# Patient Record
Sex: Female | Born: 1950 | Race: White | Hispanic: No | Marital: Married
Health system: Midwestern US, Community
[De-identification: ages and names within clinical notes are randomized; demographics above are authoritative.]

## PROBLEM LIST (undated history)

## (undated) DIAGNOSIS — N281 Cyst of kidney, acquired: Secondary | ICD-10-CM

## (undated) DIAGNOSIS — N200 Calculus of kidney: Secondary | ICD-10-CM

---

## 2016-07-17 ENCOUNTER — Telehealth

## 2016-07-17 NOTE — Telephone Encounter (Signed)
-----   Message from [facility], [place] sent at 07/17/2016  8:52 AM EDT -----  Regarding: ct  Order chem 7 and ct abd without and with iv contrast- tell patient recent CT chest showed renal cyst that we need to look at further- make apt for a few days after ct done.

## 2016-07-27 NOTE — Progress Notes (Signed)
Get in this week f/u ct.

## 2016-07-28 ENCOUNTER — Ambulatory Visit: Admit: 2016-07-28 | Discharge: 2016-07-28 | Payer: MEDICARE | Attending: Urology | Primary: Internal Medicine

## 2016-07-28 DIAGNOSIS — Z87442 Personal history of urinary calculi: Secondary | ICD-10-CM

## 2016-07-28 LAB — AMB POC URINALYSIS DIP STICK AUTO W/ MICRO (MICRO RESULTS)
Bilirubin (UA POC): NEGATIVE
Crystals (UA POC): NEGATIVE
Epithelial cells (UA POC): 0
Glucose (UA POC): NEGATIVE
Ketones (UA POC): NEGATIVE
Leukocyte esterase (UA POC): NEGATIVE
Nitrites (UA POC): NEGATIVE
Protein (UA POC): NEGATIVE mg/dL
RBCs (UA POC): 0
Specific gravity (UA POC): 1.01 (ref 1.001–1.035)
Urobilinogen (UA POC): 0.2 (ref 0.2–1)
WBCs (UA POC): 0
pH (UA POC): 7 (ref 4.6–8.0)

## 2016-07-28 NOTE — Addendum Note (Signed)
Addended by: Etheleen SiaHOLLAND, Krosby Ritchie on: 09/13/2017 12:27 PM     Modules accepted: Orders

## 2016-07-28 NOTE — Progress Notes (Signed)
HISTORY OF PRESENT ILLNESS:  Lori Walter is a 65 y.o. female who presents today for f/u hx of stones, but recently had CT of the chest which showed a partially visualized right renal cyst. 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     Pt reports doing well. No stone issues right now.       AUA Symptom Score 07/28/2016   Over the past month how often have you had the sensation that your bladder was not completely empty after you finished urinating? 0   Over the past month, how often have had to urinate again less than 2 hours after you last finished urinating? 1   Over the past month, how often have you found you stopped and started again several times when you urinated? 0   Over the past month, how often have you found it difficult to postpone urination? 1   Over the past month, how often have you had a weak urinary stream? 0   Over the past month, how often have you had to push or strain to begin urinating? 1   Over the past month, how many times did you most typically get up to urinate from the time you went to bed at night until the time you got up in the morning? 2   AUA Score 5   If you were to spend the rest of your life with your urinary condition the way it is now, how would you feel about that? Delighted       Past Medical History:   Diagnosis Date   ??? Hypercholesterolemia    ??? Hypertension    ??? Kidney stone    ??? Sick sinus syndrome (HCC)        History reviewed. No pertinent surgical history.    Social History   Substance Use Topics   ??? Smoking status: Former Smoker     Quit date: 1977   ??? Smokeless tobacco: Never Used   ??? Alcohol use Yes       No Known Allergies    Family History   Problem Relation Age of Onset   ??? Hypertension Mother    ??? Heart Disease Mother    ??? Cancer Mother    ??? Cancer Father    ??? Cancer Sister    ??? Depression Brother    ??? Cancer Maternal Aunt         Current Outpatient Prescriptions   Medication Sig Dispense Refill   ??? LIBRAX, WITH CLINIDIUM, 5-2.5 mg per capsule TAKE ONE CAPSULE BY MOUTH 3 TIMES A DAY  1   ??? escitalopram oxalate (LEXAPRO) 10 mg tablet TAKE 1 TABLET BY MOUTH EVERY EVENING  1   ??? aspirin delayed-release 81 mg tablet Take  by mouth daily.     ??? B.ANI-L.ACI-L.SAL-L.PLAN-L.CAS PO Take  by mouth.     ??? enalapril (VASOTEC) 2.5 mg tablet Take  by mouth daily.     ??? multivitamin (ONE A DAY) tablet Take 1 Tab by mouth daily.         REVIEW OF SYSTEMS:  Constitutional: Fever:   Skin: Rash:   HEENT: Hearing difficulty:   Eyes: Blurred vision:   Cardiovascular: Chest pain:   Respiratory: Shortness of breath:   Gastrointestinal: Nausea/vomiting:   Musculoskeletal: Back pain:   Neurological: Weakness:   Psychological: Memory loss:   Comments/additional findings:  PHYSICAL EXAMINATION:   Visit Vitals   ??? BP 135/73   ??? Pulse 68   ??? Temp 98 ??F (36.7 ??C)   ??? Resp 18   ??? Ht 5\' 8"  (1.727 m)   ??? Wt 164 lb 9.6 oz (74.7 kg)   ??? BMI 25.03 kg/m2     Constitutional: Well developed, no acute distress.   Eyes:  Conjunctiva normal.  Ears:  External ear normal.  Nose/Throat:  External nose normal.  CV:  Heart rate regular, No peripheral swelling noted.  Respiratory: No respiratory distress, No audible wheeze.  Abdomen:  Soft, nontender, nondistended, no mass/organomegaly.  Vascular:  Abdominal Aorta nonpalpable.  Skin: No rash, No ulcer.    Neuro/Psych:  Patient with appropriate affect.  Alert and oriented x 3.    Gait:  Normal.  GU Female:      CVA: non-tender bilaterally. Bladder not palpable.        REVIEW OF LABS AND IMAGING:      Results for orders placed or performed in visit on 07/28/16   AMB POC URINALYSIS DIP STICK AUTO W/ MICRO (MICRO RESULTS)   Result Value Ref Range    Color (UA POC) Yellow     Clarity (UA POC) Clear     Glucose (UA POC) Negative Negative    Bilirubin (UA POC) Negative Negative    Ketones (UA POC) Negative Negative     Specific gravity (UA POC) 1.010 1.001 - 1.035    Blood (UA POC) Trace Negative    pH (UA POC) 7.0 4.6 - 8.0    Protein (UA POC) Negative Negative mg/dL    Urobilinogen (UA POC) 0.2 mg/dL 0.2 - 1    Nitrites (UA POC) Negative Negative    Leukocyte esterase (UA POC) Negative Negative    Epithelial cells (UA POC) 0     WBCs (UA POC) 0      RBCs (UA POC) 0      Bacteria (UA POC) None Negative    Crystals (UA POC) Negative Negative    Other (UA POC)         ASSESSMENT:     ICD-10-CM ICD-9-CM    1. Personal history of kidney stones Z87.442 V13.01 AMB POC URINALYSIS DIP STICK AUTO W/ MICRO (MICRO RESULTS)   2. Benign cyst of right kidney N28.1 753.10          PLAN:    ?? F/u 1 yr   ?? KUB 2 yrs  ?? No indication for further evaluation of renal cyst  ?? Sees pulmonary tomorrow     Patient's BMI is out of the normal parameters.  Information about BMI was given to the patient.      Chief Complaint   Patient presents with   ??? Renal Cyst     f/u CT     Medical documentation provided with the assistance of Isac SarnaKimber N Moore, medical scribe for [employer] Life[employer]i Tyreon Frigon, DO, [place].    Ulyses Panico Arcadia[place]ughart, DO

## 2016-08-21 DIAGNOSIS — M859 Disorder of bone density and structure, unspecified: Secondary | ICD-10-CM

## 2016-08-21 DIAGNOSIS — M8580 Other specified disorders of bone density and structure, unspecified site: Secondary | ICD-10-CM

## 2016-08-21 HISTORY — DX: Other specified disorders of bone density and structure, unspecified site: M85.80

## 2016-08-21 HISTORY — DX: Disorder of bone density and structure, unspecified: M85.9

## 2016-11-19 ENCOUNTER — Encounter: Attending: Urology | Primary: Internal Medicine

## 2017-07-28 ENCOUNTER — Encounter: Attending: Urology | Primary: Internal Medicine

## 2017-08-13 ENCOUNTER — Encounter: Attending: Urology | Primary: Internal Medicine

## 2017-09-10 ENCOUNTER — Ambulatory Visit: Admit: 2017-09-10 | Discharge: 2017-09-10 | Payer: MEDICARE | Attending: Urology | Primary: Internal Medicine

## 2017-09-10 DIAGNOSIS — R7309 Other abnormal glucose: Secondary | ICD-10-CM | POA: Insufficient documentation

## 2017-09-10 DIAGNOSIS — F419 Anxiety disorder, unspecified: Secondary | ICD-10-CM

## 2017-09-10 DIAGNOSIS — Z87442 Personal history of urinary calculi: Secondary | ICD-10-CM

## 2017-09-10 HISTORY — DX: Other abnormal glucose: R73.09

## 2017-09-10 HISTORY — DX: Anxiety disorder, unspecified: F41.9

## 2017-09-10 NOTE — Progress Notes (Signed)
HISTORY OF PRESENT ILLNESS:  Lori CASAMENTO is a 66 y.o. female who presents today for f/u hx of stones and 2.8cm right Bosniak 2 renal cyst.    Pt reports doing well. No stone issues right now. Denies any dysuria or hematuria.    AUA Symptom Score 09/10/2017   Over the past month how often have you had the sensation that your bladder was not completely empty after you finished urinating? 0   Over the past month, how often have had to urinate again less than 2 hours after you last finished urinating? 1   Over the past month, how often have you found you stopped and started again several times when you urinated? 0   Over the past month, how often have you found it difficult to postpone urination? 0   Over the past month, how often have you had a weak urinary stream? 0   Over the past month, how often have you had to push or strain to begin urinating? 1   Over the past month, how many times did you most typically get up to urinate from the time you went to bed at night until the time you got up in the morning? 2   AUA Score 4   If you were to spend the rest of your life with your urinary condition the way it is now, how would you feel about that? Pleased       Past Medical History:   Diagnosis Date   ??? Hypercholesterolemia    ??? Hypertension    ??? Kidney stone    ??? Sick sinus syndrome ([place])      History reviewed. No pertinent surgical history.    Social History     Tobacco Use   ??? Smoking status: Former Smoker     Last attempt to quit: 1977     Years since quitting: 41.8   ??? Smokeless tobacco: Never Used   Substance Use Topics   ??? Alcohol use: Yes   ??? Drug use: No     No Known Allergies    Family History   Problem Relation Age of Onset   ??? Hypertension Mother    ??? Heart Disease Mother    ??? Cancer Mother    ??? Cancer Father    ??? Cancer Sister    ??? Depression Brother    ??? Cancer Maternal Aunt        Current Outpatient Medications   Medication Sig Dispense Refill    ??? Lancing Device with Lancets (ACCU-CHEK FASTCLIX LANCING DEV) kit Accu-Chek FastClix Lancing Device     ??? glucose blood VI test strips (ACCU-CHEK SMARTVIEW TEST STRIP) strip Accu-Chek SmartView Test Strips     ??? escitalopram oxalate (LEXAPRO) 5 mg tablet TAKE 1/2 TABLET BY MOUTH DAILY  0   ??? LIBRAX, WITH CLINIDIUM, 5-2.5 mg per capsule TAKE ONE CAPSULE BY MOUTH 3 TIMES A DAY  5   ??? escitalopram oxalate (LEXAPRO) 10 mg tablet TAKE 1 TABLET BY MOUTH EVERY EVENING  1   ??? aspirin delayed-release 81 mg tablet Take  by mouth daily.     ??? enalapril (VASOTEC) 2.5 mg tablet Take  by mouth daily.     ??? multivitamin (ONE A DAY) tablet Take 1 Tab by mouth daily.         REVIEW OF SYSTEMS:  Constitutional: Fever: No  Skin: Rash: No  HEENT: Hearing difficulty: No  Eyes: Blurred vision: No  Cardiovascular: Chest pain: No  Respiratory: Shortness of breath: No  Gastrointestinal: Nausea/vomiting: No  Musculoskeletal: Back pain: No  Neurological: Weakness: No  Psychological: Memory loss: No  Comments/additional findings:       PHYSICAL EXAMINATION:   Visit Vitals  BP 144/74   Pulse (!) 50   Temp 98 ??F (36.7 ??C) (Temporal)   Resp 18   Ht '5\' 8"'  (1.727 m)   Wt 170 lb (77.1 kg)   BMI 25.85 kg/m??     Constitutional: Well developed, no acute distress.   Eyes:  Conjunctiva normal.  Ears:  External ear normal.  Nose/Throat:  External nose normal.  CV:  Heart rate regular. No peripheral swelling noted.  Respiratory: No respiratory distress. No audible wheeze.  Abdomen:  Soft, nontender, nondistended, no mass/organomegaly.  Vascular:  Abdominal Aorta nonpalpable.  Skin: No rash. No ulcer.    Neuro/Psych:  Patient with appropriate affect.  Alert and oriented x 3.    Gait:  Normal.  GU Female:      CVA: non-tender bilaterally. Bladder not palpable.        REVIEW OF LABS AND IMAGING:    No labs or radiology this visit.      ASSESSMENT:     ICD-10-CM ICD-9-CM    1. Personal history of kidney stones Z87.442 V13.01     2. Benign cyst of right kidney N28.1 753.10         PLAN:    ?? F/u 1 yr w/ KUB    Patient's BMI is out of the normal parameters. Information about BMI was given and patient was advised to follow-up with their PCP for further management.    Chief Complaint   Patient presents with   ??? Kidney [employer] documentation provided with the assistance of Lonell Grandchild, medical scribe for [employer], DO, [place].    Maliq Pilley [place], DO

## 2018-08-29 ENCOUNTER — Encounter

## 2018-08-29 NOTE — Telephone Encounter (Signed)
Called pt to let her know she needs to have KUB done before her next appt on 11/13. She can go to the hospital and have it done anytime before.  I have faxed order over to radiology.   I left VM for pt to call back.

## 2018-09-07 ENCOUNTER — Encounter

## 2018-09-14 ENCOUNTER — Ambulatory Visit: Admit: 2018-09-14 | Discharge: 2018-09-14 | Attending: Urology | Primary: Internal Medicine

## 2018-09-14 ENCOUNTER — Ambulatory Visit: Attending: Urology | Primary: Internal Medicine

## 2018-09-14 DIAGNOSIS — Z87442 Personal history of urinary calculi: Secondary | ICD-10-CM

## 2018-09-14 NOTE — Progress Notes (Signed)
 Progress  Notes by Steen Drown L at 09/14/18 1300                Author: Steen Drown CROME  Service: --  Author Type: Scribe       Filed: 09/16/18 1243  Encounter Date: 09/14/2018  Status: Signed          Editor: Jayme Skates, DO (Physician)                          HISTORY OF PRESENT ILLNESS:  Lori Walter  is a 67 y.o. female who presents  today for f/u hx of stones and 2.8cm right Bosniak 2 renal cyst.  KUB 08/2018 negative for stones. Patient denies stone pain/gh/dysuria.  Occasional hesitancy but not bothersome and has been doing timed voids which have helped.         AUA Symptom Score  09/14/2018        Over the past month how often have you had the sensation that your bladder was not completely empty after you finished urinating?  1     Over the past month, how often have had to urinate again less than 2 hours after you last finished urinating?  1     Over the past month, how often have you found you stopped and started again several times when you urinated?  0     Over the past month, how often have you found it difficult to postpone urination?  1     Over the past month, how often have you had a weak urinary stream?  0     Over the past month, how often have you had to push or strain to begin urinating?  1     Over the past month, how many times did you most typically get up to urinate from the time you went to bed at night until the time you got up in  the morning?  2     AUA Score  6        If you were to spend the rest of your life with your urinary condition the way it is now, how would you feel about that?  Mostly satisfied             Past Medical History:        Diagnosis  Date         ?  Hypercholesterolemia       ?  Hypertension       ?  Kidney stone           ?  Sick sinus syndrome (HCC)          History reviewed. No pertinent surgical history.        Social History          Tobacco Use         ?  Smoking status:  Former Smoker              Last attempt to quit:  1977          Years since quitting:  42.8         ?  Smokeless tobacco:  Never Used       Substance Use Topics         ?  Alcohol use:  Yes         ?  Drug use:  No        No Known  Allergies        Family History         Problem  Relation  Age of Onset          ?  Hypertension  Mother       ?  Heart Disease  Mother       ?  Cancer  Mother       ?  Cancer  Father       ?  Cancer  Sister       ?  Depression  Brother            ?  Cancer  Maternal Aunt               Current Outpatient Medications          Medication  Sig  Dispense  Refill           ?  Lancing Device with Lancets (ACCU-CHEK FASTCLIX LANCING DEV) kit  Accu-Chek FastClix Lancing Device         ?  glucose blood VI test strips (ACCU-CHEK SMARTVIEW TEST STRIP) strip  Accu-Chek SmartView Test Strips         ?  LIBRAX, WITH CLINIDIUM, 5-2.5 mg per capsule  TAKE ONE CAPSULE BY MOUTH 3 TIMES A DAY    5     ?  escitalopram oxalate (LEXAPRO) 10 mg tablet  TAKE 1 TABLET BY MOUTH EVERY EVENING    1     ?  aspirin delayed-release 81 mg tablet  Take  by mouth daily.         ?  enalapril (VASOTEC) 2.5 mg tablet  Take  by mouth daily.               ?  multivitamin (ONE A DAY) tablet  Take 1 Tab by mouth daily.               REVIEW OF SYSTEMS:   Constitutional: Fever: No   Skin: Rash: No   HEENT: Hearing difficulty: No   Eyes: Blurred vision: No   Cardiovascular: Chest pain: No   Respiratory: Shortness of breath: No   Gastrointestinal: Nausea/vomiting: No   Musculoskeletal: Back pain: No   Neurological: Weakness: No   Psychological: Memory loss: No   Comments/additional findings:          PHYSICAL EXAMINATION:    Visit Vitals      BP  137/81     Pulse  (!) 52     Temp  97.5 F (36.4 C)     Resp  18     Ht  5' 8 (1.727 m)     Wt  170 lb 3.2 oz (77.2 kg)        BMI  25.88 kg/m        Constitutional: Well developed, no acute distress.    Eyes:  Conjunctiva normal.   Ears:  External ear normal.   Nose/Throat:  External nose normal.   CV:  Heart rate regular.  No peripheral swelling  noted.   Respiratory: No respiratory distress. No audible wheeze.   Skin: No rash. No ulcer.      Neuro/Psych:  Patient with appropriate affect.  Alert and oriented x 3.      Gait:  Normal.         REVIEW OF LABS AND IMAGING:     No labs or radiology this visit.         ASSESSMENT:  ICD-10-CM  ICD-9-CM             1.  Personal history of kidney stones  Z87.442  V13.01              PLAN:       KUB negative for stones.      F/u 1 yr     kub 2 yrs      Patient's BMI is out of the normal parameters. Information about BMI was given and patient was advised to follow-up with their PCP for further management.        Chief Complaint       Patient presents with        ?  Kidney [employer] documentation provided with the assistance of Harris LOISE Ada, medical  scribe for [employer], DO, [place].      Christi [place], DO

## 2018-09-14 NOTE — Progress Notes (Signed)
HISTORY OF PRESENT ILLNESS:  Lori Walter is a 67 y.o. female who presents today for f/u hx of stones and 2.8cm right Bosniak 2 renal cyst.  KUB 08/2018 negative for stones. Patient denies stone pain/gh/dysuria.  Occasional hesitancy but not bothersome and has been doing timed voids which have helped.    AUA Symptom Score 09/14/2018   Over the past month how often have you had the sensation that your bladder was not completely empty after you finished urinating? 1   Over the past month, how often have had to urinate again less than 2 hours after you last finished urinating? 1   Over the past month, how often have you found you stopped and started again several times when you urinated? 0   Over the past month, how often have you found it difficult to postpone urination? 1   Over the past month, how often have you had a weak urinary stream? 0   Over the past month, how often have you had to push or strain to begin urinating? 1   Over the past month, how many times did you most typically get up to urinate from the time you went to bed at night until the time you got up in the morning? 2   AUA Score 6   If you were to spend the rest of your life with your urinary condition the way it is now, how would you feel about that? Mostly satisfied       Past Medical History:   Diagnosis Date   ??? Hypercholesterolemia    ??? Hypertension    ??? Kidney stone    ??? Sick sinus syndrome ([place])      History reviewed. No pertinent surgical history.    Social History     Tobacco Use   ??? Smoking status: Former Smoker     Last attempt to quit: 1977     Years since quitting: 42.8   ??? Smokeless tobacco: Never Used   Substance Use Topics   ??? Alcohol use: Yes   ??? Drug use: No     No Known Allergies    Family History   Problem Relation Age of Onset   ??? Hypertension Mother    ??? Heart Disease Mother    ??? Cancer Mother    ??? Cancer Father    ??? Cancer Sister    ??? Depression Brother    ??? Cancer Maternal Aunt         Current Outpatient Medications   Medication Sig Dispense Refill   ??? Lancing Device with Lancets (ACCU-CHEK FASTCLIX LANCING DEV) kit Accu-Chek FastClix Lancing Device     ??? glucose blood VI test strips (ACCU-CHEK SMARTVIEW TEST STRIP) strip Accu-Chek SmartView Test Strips     ??? LIBRAX, WITH CLINIDIUM, 5-2.5 mg per capsule TAKE ONE CAPSULE BY MOUTH 3 TIMES A DAY  5   ??? escitalopram oxalate (LEXAPRO) 10 mg tablet TAKE 1 TABLET BY MOUTH EVERY EVENING  1   ??? aspirin delayed-release 81 mg tablet Take  by mouth daily.     ??? enalapril (VASOTEC) 2.5 mg tablet Take  by mouth daily.     ??? multivitamin (ONE A DAY) tablet Take 1 Tab by mouth daily.         REVIEW OF SYSTEMS:  Constitutional: Fever: No  Skin: Rash: No  HEENT: Hearing difficulty: No  Eyes: Blurred vision: No  Cardiovascular: Chest pain: No  Respiratory: Shortness of breath: No  Gastrointestinal: Nausea/vomiting:  No  Musculoskeletal: Back pain: No  Neurological: Weakness: No  Psychological: Memory loss: No  Comments/additional findings:       PHYSICAL EXAMINATION:   Visit Vitals  BP 137/81   Pulse (!) 52   Temp 97.5 ??F (36.4 ??C)   Resp 18   Ht '5\' 8"'  (1.727 m)   Wt 170 lb 3.2 oz (77.2 kg)   BMI 25.88 kg/m??     Constitutional: Well developed, no acute distress.   Eyes:  Conjunctiva normal.  Ears:  External ear normal.  Nose/Throat:  External nose normal.  CV:  Heart rate regular. No peripheral swelling noted.  Respiratory: No respiratory distress. No audible wheeze.  Skin: No rash. No ulcer.    Neuro/Psych:  Patient with appropriate affect.  Alert and oriented x 3.    Gait:  Normal.      REVIEW OF LABS AND IMAGING:    No labs or radiology this visit.      ASSESSMENT:     ICD-10-CM ICD-9-CM    1. Personal history of kidney stones Z87.442 V13.01         PLAN:    ?? KUB negative for stones.   ?? F/u 1 yr  ?? kub 2 yrs    Patient's BMI is out of the normal parameters. Information about BMI was given and patient was advised to follow-up with their PCP for further  management.    Chief Complaint   Patient presents with   ??? Kidney [employer] documentation provided with the assistance of Lonell Grandchild, medical scribe for [employer], DO, [place].    Christi [place], DO

## 2019-09-13 ENCOUNTER — Encounter: Payer: MEDICARE | Attending: Urology | Primary: Internal Medicine

## 2019-09-13 ENCOUNTER — Encounter: Attending: Urology | Primary: Internal Medicine

## 2019-10-13 ENCOUNTER — Encounter: Payer: MEDICARE | Attending: Urology | Primary: Internal Medicine

## 2019-10-16 ENCOUNTER — Ambulatory Visit: Admit: 2019-10-16 | Discharge: 2019-10-16 | Payer: MEDICARE | Attending: Urology | Primary: Internal Medicine

## 2019-10-16 ENCOUNTER — Ambulatory Visit: Attending: Urology | Primary: Internal Medicine

## 2019-10-16 DIAGNOSIS — Z87442 Personal history of urinary calculi: Secondary | ICD-10-CM

## 2019-10-16 NOTE — Progress Notes (Signed)
Progress Notes by Lori Neri, DO at 10/16/19 1030                Author: Lavone Neri, DO  Service: --  Author Type: Physician       Filed: 11/01/19 1641  Encounter Date: 10/16/2019  Status: Signed          Editor: Lori Neri, DO (Physician)                              HISTORY OF PRESENT ILLNESS:  Lori Walter  is a 68 y.o. female who presents  today for f/u hx of stones and 2.8cm right Bosniak 2 renal cyst.  KUB 08/2018 negative for stones. Patient denies stone pain/gh/dysuria.  Occasional hesitancy but not bothersome and has been doing timed voids which have helped.      10/16/19-here for 1 year follow-up history of kidney stones.  Denies any kidney stone pain, gross hematuria, dysuria.         AUA Symptom Score  10/16/2019        Over the past month how often have you had the sensation that your bladder was not completely empty after you finished urinating?  0     Over the past month, how often have had to urinate again less than 2 hours after you last finished urinating?  0     Over the past month, how often have you found you stopped and started again several times when you urinated?  0     Over the past month, how often have you found it difficult to postpone urination?  1     Over the past month, how often have you had a weak urinary stream?  0     Over the past month, how often have you had to push or strain to begin urinating?  1     Over the past month, how many times did you most typically get up to urinate from the time you went to bed at night until the time you got up in  the morning?  2     AUA Score  4        If you were to spend the rest of your life with your urinary condition the way it is now, how would you feel about that?  Pleased             Past Medical History:        Diagnosis  Date         ?  Hypercholesterolemia       ?  Hypertension       ?  Kidney stone           ?  Sick sinus syndrome ([place])             History reviewed. No pertinent surgical  history.        Social History          Tobacco Use         ?  Smoking status:  Former Smoker              Quit date:  1977         Years since quitting:  44.0         ?  Smokeless tobacco:  Never Used       Substance Use Topics         ?  Alcohol  use:  Yes         ?  Drug use:  No             Allergies        Allergen  Reactions         ?  Ace Inhibitors  Cough     ?  Losartan  Unknown (comments)         ?  Nifedipine  Unknown (comments)             Family History         Problem  Relation  Age of Onset          ?  Hypertension  Mother       ?  Heart Disease  Mother       ?  Cancer  Mother       ?  Cancer  Father       ?  Cancer  Sister       ?  Depression  Brother            ?  Cancer  Maternal Aunt               Current Outpatient Medications          Medication  Sig  Dispense  Refill           ?  Bifidobacterium Infantis 4 mg cap  Take 1 Cap by mouth daily.         ?  Lactobacillus acidophilus 10 billion cell cap  Take  by mouth.         ?  Lancing Device with Lancets (ACCU-CHEK FASTCLIX LANCING DEV) kit  Accu-Chek FastClix Lancing Device         ?  glucose blood VI test strips (ACCU-CHEK SMARTVIEW TEST STRIP) strip  Accu-Chek SmartView Test Strips         ?  LIBRAX, WITH CLINIDIUM, 5-2.5 mg per capsule  TAKE ONE CAPSULE BY MOUTH 3 TIMES A DAY    5     ?  escitalopram oxalate (LEXAPRO) 10 mg tablet  TAKE 1 TABLET BY MOUTH EVERY EVENING    1     ?  aspirin delayed-release 81 mg tablet  Take  by mouth daily.         ?  enalapril (VASOTEC) 2.5 mg tablet  Take  by mouth daily.               ?  multivitamin (ONE A DAY) tablet  Take 1 Tab by mouth daily.               REVIEW OF SYSTEMS:   Constitutional: Fever: No   Skin: Rash: No   HEENT: Hearing difficulty: No   Eyes: Blurred vision: No   Cardiovascular: Chest pain: No   Respiratory: Shortness of breath: No   Gastrointestinal: Nausea/vomiting: No   Musculoskeletal: Back pain: No   Neurological: Weakness: No   Psychological: Memory loss: No   Comments/additional  findings:       PHYSICAL EXAMINATION:    Visit Vitals      Temp  98 ??F (36.7 ??C) (Temporal)     Ht  '5\' 8"'$  (1.727 m)     Wt  172 lb (78 kg)        BMI  26.15 kg/m??        Constitutional: Well developed, no acute distress.    Eyes:  Conjunctiva normal.   Ears:  External ear normal.   Nose/Throat:  External nose normal.   CV:  Heart rate regular, No peripheral swelling noted.   Respiratory: No respiratory distress, No audible wheeze.   Skin: No rash, No ulcer.     Neuro/Psych:  Patient with appropriate affect.  Alert and oriented x 3.     Gait:  Normal.      REVIEW OF LABS AND IMAGING:          Results for orders placed or performed in visit on 07/28/16     AMB POC URINALYSIS DIP STICK AUTO W/ MICRO (MICRO RESULTS)         Result  Value  Ref Range            Color (UA POC)  Yellow         Clarity (UA POC)  Clear         Glucose (UA POC)  Negative  Negative       Bilirubin (UA POC)  Negative  Negative       Ketones (UA POC)  Negative  Negative       Specific gravity (UA POC)  1.010  1.001 - 1.035       Blood (UA POC)  Trace  Negative       pH (UA POC)  7.0  4.6 - 8.0       Protein (UA POC)  Negative  Negative mg/dL       Urobilinogen (UA POC)  0.2 mg/dL  0.2 - 1       Nitrites (UA POC)  Negative  Negative       Leukocyte esterase (UA POC)  Negative  Negative       Epithelial cells (UA POC)  0         WBCs (UA POC)  0          RBCs (UA POC)  0          Bacteria (UA POC)  None  Negative       Crystals (UA POC)  Negative  Negative            Other (UA POC)               ASSESSMENT:              ICD-10-CM  ICD-9-CM             1.  Personal history of kidney stones   Z87.442  V13.01                       PLAN:     ??  KUB in 1 year             Chief Complaint       Patient presents with        ?  Follow-up             63yr       ?  Kidney Stone           A copy of today's office visit was sent to the referring physician.        Lori Walter HEast [place] DO

## 2019-10-16 NOTE — Progress Notes (Signed)
HISTORY OF PRESENT ILLNESS:  Lori Walter is a 68 y.o. female who presents today for f/u hx of stones and 2.8cm right Bosniak 2 renal cyst.  KUB 08/2018 negative for stones. Patient denies stone pain/gh/dysuria.  Occasional hesitancy but not bothersome and has been doing timed voids which have helped.    10/16/19-here for 1 year follow-up history of kidney stones.  Denies any kidney stone pain, gross hematuria, dysuria.    AUA Symptom Score 10/16/2019   Over the past month how often have you had the sensation that your bladder was not completely empty after you finished urinating? 0   Over the past month, how often have had to urinate again less than 2 hours after you last finished urinating? 0   Over the past month, how often have you found you stopped and started again several times when you urinated? 0   Over the past month, how often have you found it difficult to postpone urination? 1   Over the past month, how often have you had a weak urinary stream? 0   Over the past month, how often have you had to push or strain to begin urinating? 1   Over the past month, how many times did you most typically get up to urinate from the time you went to bed at night until the time you got up in the morning? 2   AUA Score 4   If you were to spend the rest of your life with your urinary condition the way it is now, how would you feel about that? Pleased       Past Medical History:   Diagnosis Date   ??? Hypercholesterolemia    ??? Hypertension    ??? Kidney stone    ??? Sick sinus syndrome ([place])        History reviewed. No pertinent surgical history.    Social History     Tobacco Use   ??? Smoking status: Former Smoker     Quit date: 1977     Years since quitting: 44.0   ??? Smokeless tobacco: Never Used   Substance Use Topics   ??? Alcohol use: Yes   ??? Drug use: No       Allergies   Allergen Reactions   ??? Ace Inhibitors Cough   ??? Losartan Unknown (comments)   ??? Nifedipine Unknown (comments)       Family History    Problem Relation Age of Onset   ??? Hypertension Mother    ??? Heart Disease Mother    ??? Cancer Mother    ??? Cancer Father    ??? Cancer Sister    ??? Depression Brother    ??? Cancer Maternal Aunt        Current Outpatient Medications   Medication Sig Dispense Refill   ??? Bifidobacterium Infantis 4 mg cap Take 1 Cap by mouth daily.     ??? Lactobacillus acidophilus 10 billion cell cap Take  by mouth.     ??? Lancing Device with Lancets (ACCU-CHEK FASTCLIX LANCING DEV) kit Accu-Chek FastClix Lancing Device     ??? glucose blood VI test strips (ACCU-CHEK SMARTVIEW TEST STRIP) strip Accu-Chek SmartView Test Strips     ??? LIBRAX, WITH CLINIDIUM, 5-2.5 mg per capsule TAKE ONE CAPSULE BY MOUTH 3 TIMES A DAY  5   ??? escitalopram oxalate (LEXAPRO) 10 mg tablet TAKE 1 TABLET BY MOUTH EVERY EVENING  1   ??? aspirin delayed-release 81 mg tablet Take  by  mouth daily.     ??? enalapril (VASOTEC) 2.5 mg tablet Take  by mouth daily.     ??? multivitamin (ONE A DAY) tablet Take 1 Tab by mouth daily.         REVIEW OF SYSTEMS:  Constitutional: Fever: No  Skin: Rash: No  HEENT: Hearing difficulty: No  Eyes: Blurred vision: No  Cardiovascular: Chest pain: No  Respiratory: Shortness of breath: No  Gastrointestinal: Nausea/vomiting: No  Musculoskeletal: Back pain: No  Neurological: Weakness: No  Psychological: Memory loss: No  Comments/additional findings:     PHYSICAL EXAMINATION:   Visit Vitals  Temp 98 ??F (36.7 ??C) (Temporal)   Ht '5\' 8"'  (1.727 m)   Wt 172 lb (78 kg)   BMI 26.15 kg/m??     Constitutional: Well developed, no acute distress.   Eyes:  Conjunctiva normal.  Ears:  External ear normal.  Nose/Throat:  External nose normal.  CV:  Heart rate regular, No peripheral swelling noted.  Respiratory: No respiratory distress, No audible wheeze.  Skin: No rash, No ulcer.    Neuro/Psych:  Patient with appropriate affect.  Alert and oriented x 3.    Gait:  Normal.    REVIEW OF LABS AND IMAGING:      Results for orders placed or performed in visit on 07/28/16    AMB POC URINALYSIS DIP STICK AUTO W/ MICRO (MICRO RESULTS)   Result Value Ref Range    Color (UA POC) Yellow     Clarity (UA POC) Clear     Glucose (UA POC) Negative Negative    Bilirubin (UA POC) Negative Negative    Ketones (UA POC) Negative Negative    Specific gravity (UA POC) 1.010 1.001 - 1.035    Blood (UA POC) Trace Negative    pH (UA POC) 7.0 4.6 - 8.0    Protein (UA POC) Negative Negative mg/dL    Urobilinogen (UA POC) 0.2 mg/dL 0.2 - 1    Nitrites (UA POC) Negative Negative    Leukocyte esterase (UA POC) Negative Negative    Epithelial cells (UA POC) 0     WBCs (UA POC) 0      RBCs (UA POC) 0      Bacteria (UA POC) None Negative    Crystals (UA POC) Negative Negative    Other (UA POC)         ASSESSMENT:     ICD-10-CM ICD-9-CM    1. Personal history of kidney stones  Z87.442 V13.01               PLAN:    ?? KUB in 1 year       Chief Complaint   Patient presents with   ??? Follow-up     83yr  ??? Kidney Stone       A copy of today's office visit was sent to the referring physician.      Mamadou Breon HB[place] DO

## 2020-10-10 ENCOUNTER — Other Ambulatory Visit: Payer: Self-pay | Admitting: *Deleted

## 2020-10-10 DIAGNOSIS — N200 Calculus of kidney: Secondary | ICD-10-CM

## 2020-10-11 ENCOUNTER — Ambulatory Visit
Admission: RE | Admit: 2020-10-11 | Discharge: 2020-10-11 | Disposition: A | Discharge status: Home / Self Care | Payer: Medicare Other | Source: Ambulatory Visit | Attending: Urology | Admitting: Urology

## 2020-10-11 ENCOUNTER — Ambulatory Visit (INDEPENDENT_AMBULATORY_CARE_PROVIDER_SITE_OTHER): Payer: Medicare Other | Admitting: Urology

## 2020-10-11 ENCOUNTER — Ambulatory Visit
Admission: RE | Admit: 2020-10-11 | Discharge: 2020-10-11 | Disposition: A | Discharge status: Home / Self Care | Payer: Medicare Other | Attending: Urology | Admitting: Urology

## 2020-10-11 ENCOUNTER — Other Ambulatory Visit: Payer: Self-pay

## 2020-10-11 ENCOUNTER — Encounter: Payer: Self-pay | Admitting: Urology

## 2020-10-11 VITALS — BP 149/81 | HR 47 | Ht 68.5 in | Wt 166.0 lb

## 2020-10-11 DIAGNOSIS — N200 Calculus of kidney: Secondary | ICD-10-CM | POA: Diagnosis not present

## 2020-10-11 DIAGNOSIS — Z87442 Personal history of urinary calculi: Secondary | ICD-10-CM | POA: Diagnosis not present

## 2020-10-11 HISTORY — DX: Personal history of urinary calculi: Z87.442

## 2020-10-11 NOTE — Progress Notes (Signed)
10/11/2020 2:01 PM   Haley Lam 1951-10-03 [id number]  Referring provider: No referring provider defined for this encounter.  Chief Complaint  Patient presents with  . New Patient (Initial Visit)    HPI: 69 year old female who presents today to establish care.  She was previously followed by Dr. Cleda Mccreedy in [place].  She has a personal history of kidney stones as well as a 2.8 cm right Bosniak 2 renal cyst.  She was last seen in 2020 for the same issues.  She has been followed by annual KUB.  She had a KUB today which shows no obvious stone burden.  We have no previous imaging here at our facility for comparison.  Today, she denies any interval stone episodes.  She has no flank pain.  She reports that she passed a few small stones many many years ago but is never required surgical intervention for these.  She is not had any stones in the recent past.  Per report, KUB was negative last year as well.  No significant bladder symptoms.  She does drink a lot of water, at least 6 to 8 glasses/day because of her stone history.  No urgency or frequency.  She does engage in timed voiding.   PMH: Past Medical History:  Diagnosis Date  . Anxiety 09/10/2017  . Blood glucose abnormal 09/10/2017  . Low bone density 08/21/2016  . Personal history of kidney stones 10/11/2020    Surgical History: No previous surgeries  Home Medications:  Allergies as of 10/11/2020      Reactions   Ace Inhibitors    Other reaction(s): Cough   Losartan    Other reaction(s): unknown, Unknown (comments)   Nifedipine    Other reaction(s): unknown, Unknown (comments)      Medication List       Accurate as of October 11, 2020  2:01 PM. If you have any questions, ask your nurse or doctor.        Accu-Chek SmartView test strip Generic drug: glucose blood Accu-Chek SmartView Test Strips   aspirin 81 MG chewable tablet Chew 1 tablet by mouth daily.   escitalopram 5 MG  tablet Commonly known as: LEXAPRO Take 4 tablets by mouth daily.   EYE DROPS OP Apply 1 drop to eye at bedtime.   Librax 5-2.5 MG capsule Generic drug: clidinium-chlordiazePOXIDE Take 1 capsule by mouth daily.   Probiotic (Lactobacillus) Caps Take 1 tablet by mouth daily.   PX Complete Senior Multivits Tabs Take 1 tablet by mouth daily.       Allergies:  Allergies  Allergen Reactions  . Ace Inhibitors     Other reaction(s): Cough  . Losartan     Other reaction(s): unknown, Unknown (comments)  . Nifedipine     Other reaction(s): unknown, Unknown (comments)    Family History: No family history on file.  Social History:  reports that she quit smoking about 45 years ago. She has never used smokeless tobacco. She reports previous alcohol use. She reports that she does not use drugs.   Physical Exam: BP (!) 149/81   Pulse (!) 47   Ht 5' 8.5" (1.74 m)   Wt 166 lb (75.3 kg)   BMI 24.87 kg/m   Constitutional:  Alert and oriented, No acute distress. HEENT: [place] AT, moist mucus membranes.  Trachea midline, no masses. Cardiovascular: No clubbing, cyanosis, or edema. Respiratory: Normal respiratory effort, no increased work of breathing.. Skin: No rashes, bruises or suspicious lesions. Neurologic: Grossly intact, no  focal deficits, moving all 4 extremities. Psychiatric: Normal mood and affect.  Pertinent Imaging: KUB was personally reviewed today.  She is a phlebolith in the right lower quadrant, otherwise no upper tract stone burden appreciated today.  Is slightly obscured by bowel gas.  Final radiologic interpretation pending  Assessment & Plan:    1. Personal history of kidney stones KUB negative today without any interval stone episodes over the past year  Given that she is had several years of stone free imaging with lack of interval stone episodes, do not feel that she needs imaging on an annual basis and she can follow-up as needed.  She understands that she is  in association our practice for 3 years.  Warning symptoms for stones reviewed.  We also reviewed dietary recommendations including adequate hydration, amongst others discussed today.  All questions answered.  Vanna Scotland, MD  [facility] [address] [place], [place] [address] [phone]

## 2020-10-15 ENCOUNTER — Ambulatory Visit: Payer: Self-pay | Admitting: Urology

## 2022-04-27 IMAGING — CR DG ABDOMEN 1V
2 series · 2 of 2 positions shown · non-contrast
Comparison: None.

CLINICAL DATA: Kidney stones.

EXAM:
ABDOMEN - 1 VIEW

[abdomen kub (1 of 2)]
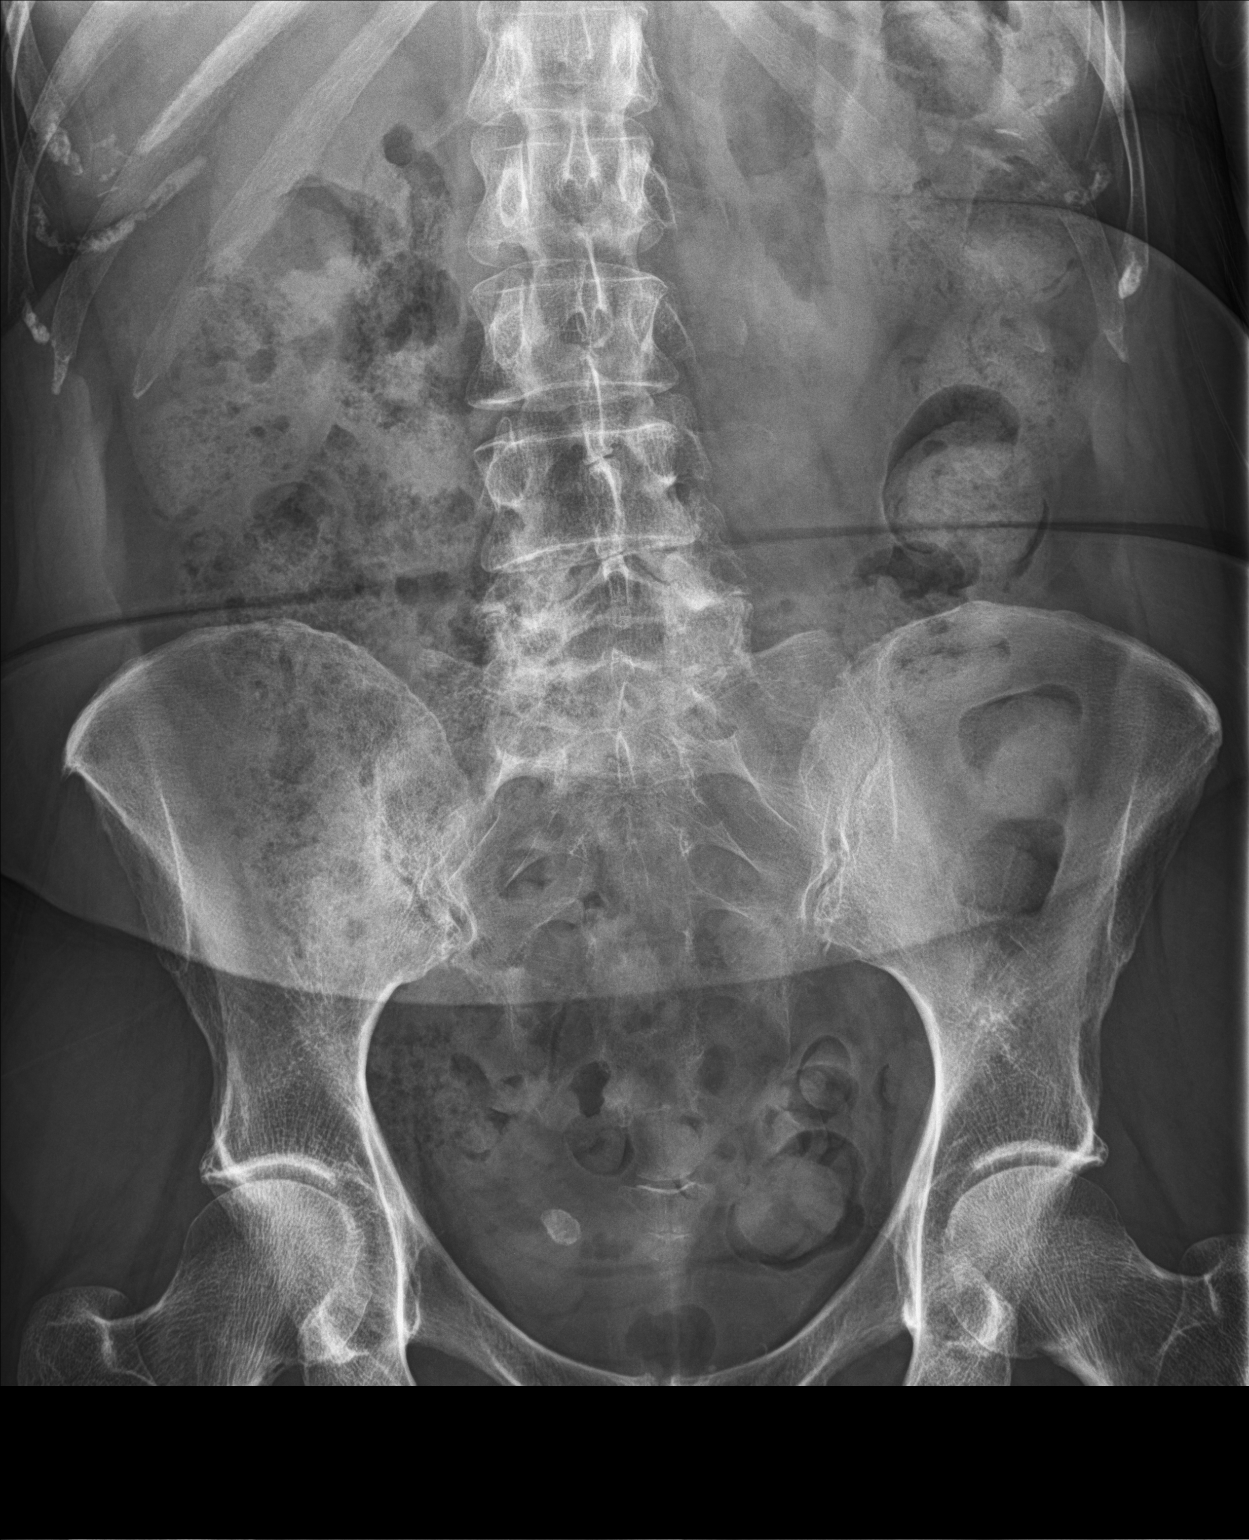

[abdomen kub (2 of 2)]
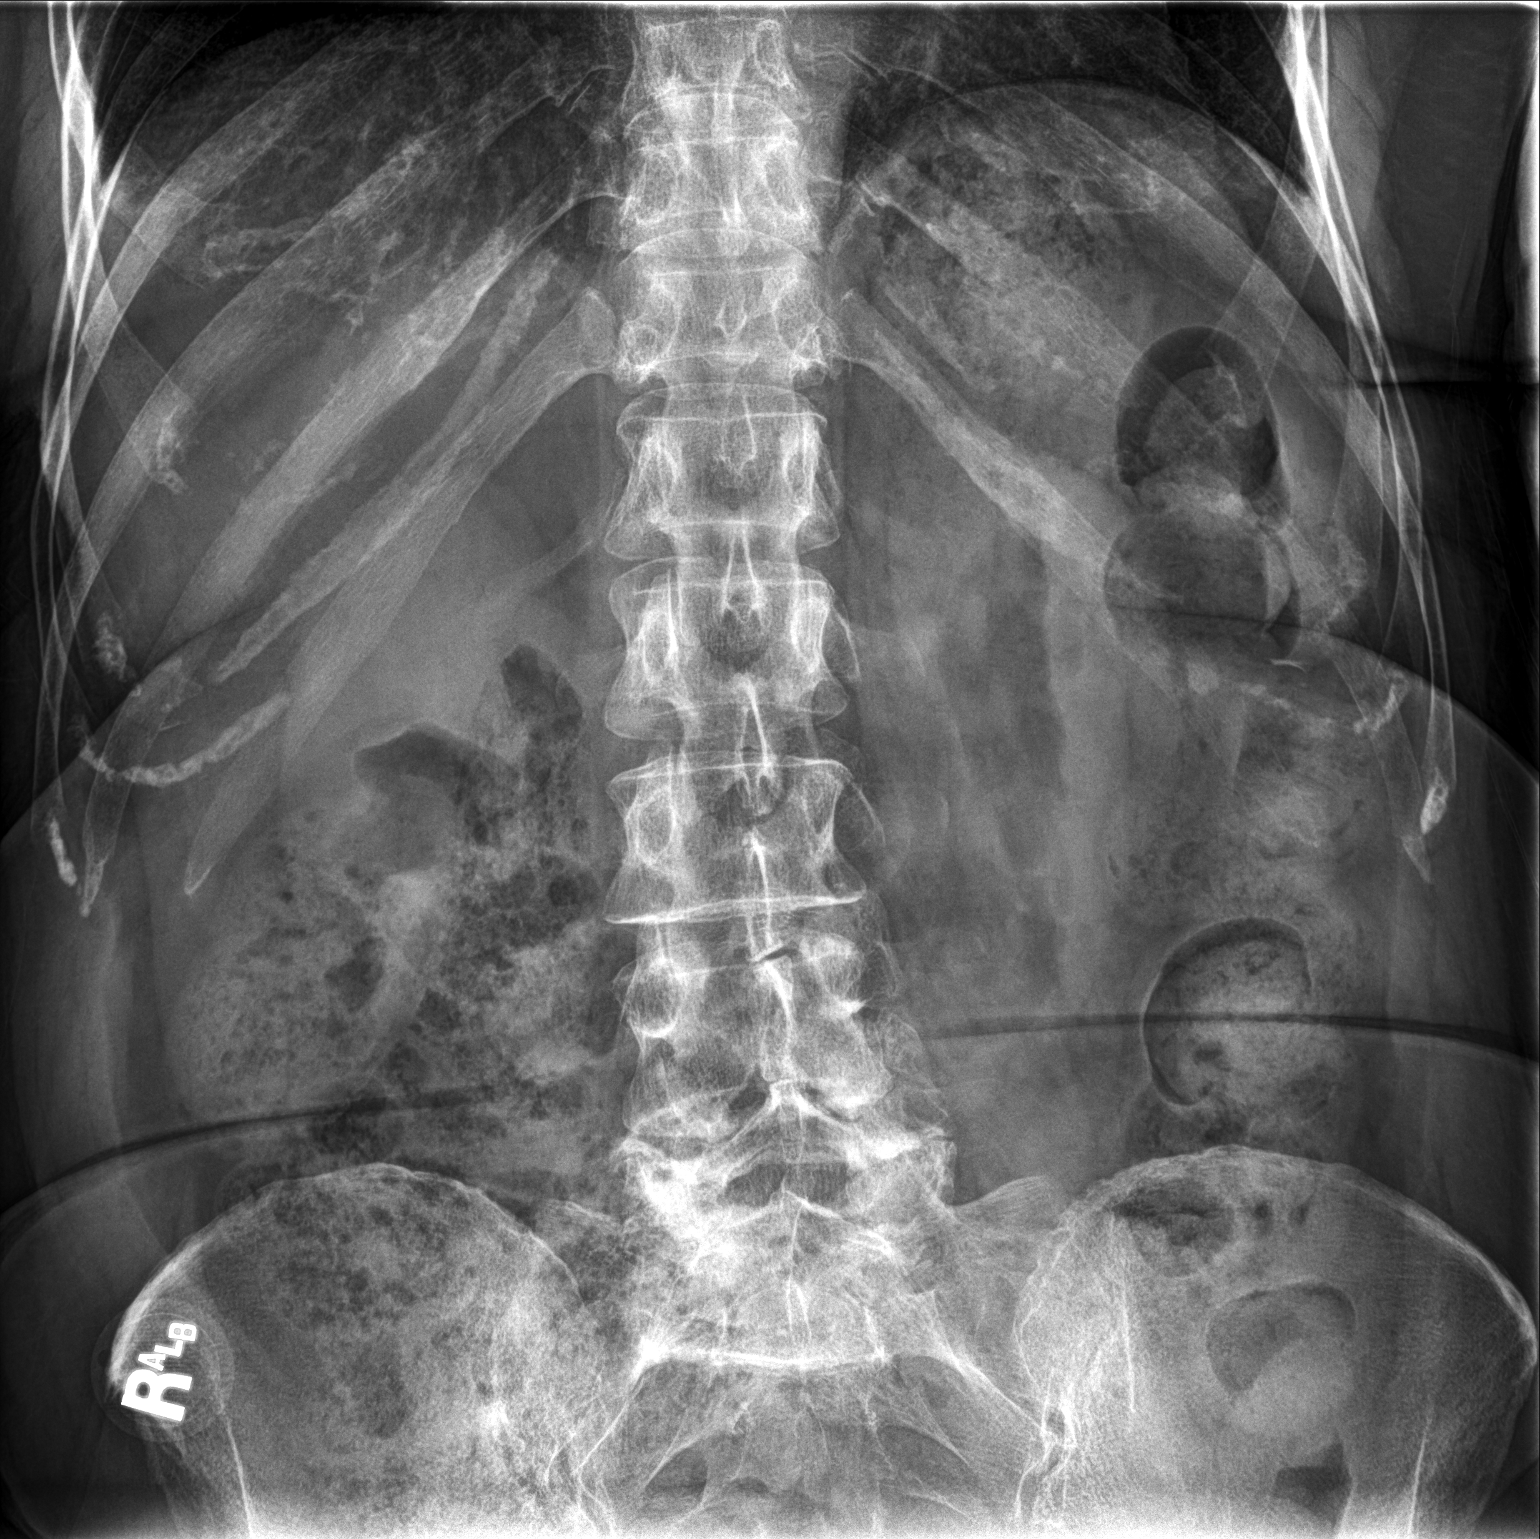

[2 of 2 positions shown; findings below may reference images not displayed]

FINDINGS: The bowel gas pattern is normal. No definite nephrolithiasis is
noted. Probable phleboliths seen in the right pelvis.
IMPRESSION: No definite nephrolithiasis is noted.
